# Patient Record
Sex: Male | Born: 1965 | Race: Asian | Hispanic: No | Marital: Married | Smoking: Current every day smoker
Health system: Southern US, Community
[De-identification: ages and names within clinical notes are randomized; demographics above are authoritative.]

## PROBLEM LIST (undated history)

## (undated) DIAGNOSIS — I10 Essential (primary) hypertension: Secondary | ICD-10-CM

## (undated) DIAGNOSIS — N200 Calculus of kidney: Secondary | ICD-10-CM

## (undated) HISTORY — DX: Calculus of kidney: N20.0

## (undated) HISTORY — PX: VEIN SURGERY: SHX48

---

## 2010-10-07 DIAGNOSIS — I8390 Asymptomatic varicose veins of unspecified lower extremity: Secondary | ICD-10-CM | POA: Insufficient documentation

## 2010-10-07 DIAGNOSIS — R141 Gas pain: Secondary | ICD-10-CM | POA: Insufficient documentation

## 2011-01-26 DIAGNOSIS — L409 Psoriasis, unspecified: Secondary | ICD-10-CM | POA: Insufficient documentation

## 2012-05-30 DIAGNOSIS — G43009 Migraine without aura, not intractable, without status migrainosus: Secondary | ICD-10-CM | POA: Insufficient documentation

## 2012-05-30 DIAGNOSIS — R0683 Snoring: Secondary | ICD-10-CM | POA: Insufficient documentation

## 2012-05-30 DIAGNOSIS — F17200 Nicotine dependence, unspecified, uncomplicated: Secondary | ICD-10-CM | POA: Insufficient documentation

## 2012-05-31 DIAGNOSIS — I10 Essential (primary) hypertension: Secondary | ICD-10-CM | POA: Insufficient documentation

## 2012-06-16 DIAGNOSIS — E785 Hyperlipidemia, unspecified: Secondary | ICD-10-CM | POA: Insufficient documentation

## 2018-01-28 ENCOUNTER — Ambulatory Visit
Admission: EM | Admit: 2018-01-28 | Discharge: 2018-01-28 | Disposition: A | Discharge status: Home / Self Care | Payer: Self-pay | Attending: Emergency Medicine | Admitting: Emergency Medicine

## 2018-01-28 ENCOUNTER — Other Ambulatory Visit: Payer: Self-pay

## 2018-01-28 DIAGNOSIS — R51 Headache: Secondary | ICD-10-CM | POA: Insufficient documentation

## 2018-01-28 DIAGNOSIS — R519 Headache, unspecified: Secondary | ICD-10-CM

## 2018-01-28 DIAGNOSIS — I10 Essential (primary) hypertension: Secondary | ICD-10-CM | POA: Insufficient documentation

## 2018-01-28 HISTORY — DX: Essential (primary) hypertension: I10

## 2018-01-28 MED ORDER — IBUPROFEN 600 MG PO TABS: 600.0000 mg | ORAL_TABLET | Freq: Four times a day (QID) | ORAL | 0 refills | Status: DC | PRN

## 2018-01-28 MED ORDER — BUTALBITAL-APAP-CAFFEINE 50-325-40 MG PO TABS: 1.0000 | ORAL_TABLET | ORAL | 0 refills | Status: DC | PRN

## 2018-01-28 MED ORDER — AMLODIPINE BESYLATE 2.5 MG PO TABS: 2.5000 mg | ORAL_TABLET | Freq: Every day | ORAL | 1 refills | Status: DC

## 2018-01-28 NOTE — ED Triage Notes (Addendum)
Pt with 3 days of intermittent headaches, "motion sickness" (feels nauseated when moving head). Pt was diagnosed with HTN by PCP and prescribed blood pressure meds but pt never took them and stopped going to that PCP. No PCP at this time.

## 2018-01-28 NOTE — ED Provider Notes (Signed)
HPI  SUBJECTIVE:  Julian Thompson is a 52 y.o. male who presents with 2 days of intermittent gradual onset headaches  accompanied with nausea and dizziness.  States the headache is located along the top and posterior aspect of his head.  States that it lasts approximately 30 minutes to an hour.  He denies vomiting, fevers, neck stiffness, rash.  No visual changes, arm or leg weakness, facial droop, slurred speech, aphasia.  This is not the first or worst headache of his life.  States that he has had headaches like this before and they have been diagnosed as migraines.  They are different today and that they are lasting longer than usual.  States that he had a negative head MRI 1 work-up for his headaches initially.  He denies syncope, seizures.  He also states that his eyes feel tired, states that his prescription glasses are several years old and that his vision is gotten worse over the past year.  No nasal congestion, rhinorrhea, sinus pain or pressure, dental pain, ear pain.  Tried Excedrin with improvement in his symptoms.  Symptoms are worse when he is tired. The headaches are not associated with reading.  He has a past medical history of hypertension for which she was prescribed losartan and hydro-Tron.  He states that he never took either 1 of these.  His he does not check his blood pressure at home.  He smokes.  He has a history of hypercholesterolemia, migraines.  No history of MI, diabetes, stroke, kidney disease.  Family history significant for father with hypertension, aneurysm.  PMD: None.  [place] in [place].   [facility] with hypertension, he was being treated with losartan 100 mg qd, 12.5 mg chlorthalidone.  No other records available.   Past Medical History:  Diagnosis Date  . Hypertension     Past Surgical History:  Procedure Laterality Date  . VEIN SURGERY      Family History  Problem Relation Age of Onset  . Hypertension Father      Social History   Tobacco Use  . Smoking status: Current Every Day Smoker    Packs/day: 1.00    Types: Cigarettes  . Smokeless tobacco: Never Used  Substance Use Topics  . Alcohol use: Yes    Comment: rare  . Drug use: Never    No current facility-administered medications for this encounter.   Current Outpatient Medications:  .  amLODipine (NORVASC) 2.5 MG tablet, Take 1 tablet (2.5 mg total) by mouth daily., Disp: 30 tablet, Rfl: 1 .  butalbital-acetaminophen-caffeine (FIORICET, ESGIC) 50-325-40 MG tablet, Take 1-2 tablets by mouth every 4 (four) hours as needed for headache. Max 6 caps/day, Disp: 20 tablet, Rfl: 0 .  ibuprofen (ADVIL,MOTRIN) 600 MG tablet, Take 1 tablet (600 mg total) by mouth every 6 (six) hours as needed., Disp: 30 tablet, Rfl: 0  No Known Allergies   ROS  As noted in HPI.   Physical Exam  BP (!) 166/119 (BP Location: Right Arm)   Pulse 77   Temp 98.4 F (36.9 C) (Oral)   Resp 16   Ht 5\' 7"  (1.702 m)   Wt 74.8 kg   SpO2 100%   BMI 25.84 kg/m   Constitutional: Well developed, well nourished, no acute distress Eyes: PERRLA, EOMI, conjunctiva normal bilaterally, no photophobia HENT: Normocephalic, atraumatic,mucus membranes moist.  TMs normal bilaterally.  No nasal congestion.  No sinus tenderness.  Normal dentition.  No TMJ tenderness.  Neck: No cervical lymphadenopathy, meningismus, trapezial tenderness. Respiratory: Normal inspiratory effort, lungs clear bilaterally Cardiovascular: Normal rate rhythm no murmurs rubs or gallops  GI: nondistended, soft, nontender, active bowel sounds. skin: No rash, skin intact Musculoskeletal: no deformities Neurologic: Alert & oriented x 3, cranial nerves III through XII intact, finger-nose, heel shin within normal limits.  Romberg negative.  Tandem gait steady. Psychiatric: Speech and behavior appropriate   ED Course   Medications - No data to display  No orders of the defined types were placed in  this encounter.   No results found for this or any previous visit (from the past 24 hour(s)). No results found.  ED Clinical Impression  Hypertension, unspecified type  Acute nonintractable headache, unspecified headache type   ED Assessment/Plan  Outside records reviewed.  As noted in HPI.   1.  Hypertension.  Blood pressure noted today.  He is currently asymptomatic, denies chest pain, shortness of breath, pain tearing through to his back, abdominal pain, anuria, hematuria, swelling in his lower extremities.  He does not have a headache right now.  However due to his history of hypertension, will start him on 2.5 mg of Norvasc today.  He will buy a blood pressure cuff and keep a log of his blood pressure.  He is to measure his blood pressure once a day, preferably the same time every day, if he has a headache he will measure his blood pressure as well.  Providing a primary care referral list, however he may return here in 2 or 3 weeks if unable to find a primary care provider at that point time reevaluation and possible medication adjustment.  Instructed him to bring in his blood pressure cuff and log if he does return here.  2.  Headache.  Suspect migraine.  Doubt stroke, hypertensive emergency.  No evidence of sinusitis, TMJ, ICH.  Will send home with ibuprofen 600 mg combined with thousand milligrams of Tylenol together 3 or 4 times a day as needed for pain, Fioricet for severe pain.  [facility].  Feel that it is appropriate to prescribe a controlled substance today.  No opiate prescriptions in 2 years.  Discussed MDM, treatment plan, and plan for follow-up with patient. Discussed sn/sx that should prompt return to the ED. patient agrees with plan.   Meds ordered this encounter  Medications  . ibuprofen (ADVIL,MOTRIN) 600 MG tablet    Sig: Take 1 tablet (600 mg total) by mouth every 6 (six) hours as needed.    Dispense:  30 tablet    Refill:  0  .  butalbital-acetaminophen-caffeine (FIORICET, ESGIC) 50-325-40 MG tablet    Sig: Take 1-2 tablets by mouth every 4 (four) hours as needed for headache. Max 6 caps/day    Dispense:  20 tablet    Refill:  0  . amLODipine (NORVASC) 2.5 MG tablet    Sig: Take 1 tablet (2.5 mg total) by mouth daily.    Dispense:  30 tablet    Refill:  1    *This clinic note was created using Scientist, clinical (histocompatibility and immunogenetics)Dragon dictation software. Therefore, there may be occasional mistakes despite careful proofreading.   ?   Domenick GongMortenson, Valli Randol, MD 01/28/18 1715

## 2018-01-28 NOTE — Discharge Instructions (Addendum)
Decrease your salt intake. diet and exercise will lower your blood pressure significantly. It is important to keep your blood pressure under good control, as having a elevated blood pressure for prolonged periods of time significantly increases your risk of stroke, heart attacks, kidney damage, eye damage, and other problems. Measure your blood pressure once a day, preferably at the same time every day. Keep a log of this and bring it to your next doctor's appointment.  Bring your blood pressure cuff as well.  Return here in 2 weeks for blood pressure recheck if you're unable to find a primary care physician by then. Return immediately to the ER if you start having chest pain, headache, problems seeing, problems talking, problems walking, if you feel like you're about to pass out, if you do pass out, if you have a seizure, or for any other concerns.  Start taking the Norvasc daily as directed.  Here is a list of primary care providers who are taking new patients:  Dr. Elizabeth Sauereanna Jones, Dr. Schuyler AmorWilliam Plonk [address] [place] [address] [phone]  [facility] [place] [address]  [place] [address]  [phone]  [facility] [address]  [place], [place] [address] [phone]  [facility] [place] [address]  [phone] [place], [place] [address]  Here are clinics/ other resources who will see you if you do not have insurance. Some have certain criteria that you must meet. Call them and find out what they are:  [facility]: [address] [place] [place], [place] [address] Phone: [phone] Hours: First and Third Saturdays of each Month, 9 a.m. - 1 p.m.  [facility]: [address], [place], [place] [address] Phone: [phone] Hours: Tuesday, 4 p.m. - 8 p.m. Thursday, 1 p.m. - 8 p.m. Wednesday, 9 a.m. - [facility]  [facility] [address], [place], [place] [address] Phone: [phone] Pharmacy Phone  Number: [phone] Dental Phone Number: [phone] [facility] Insurance Help: [phone]  Dental Hours: Monday - Thursday, 8 a.m. - 6 p.m.  [facility] [address] [place], [place] [address] Phone: [phone] Pharmacy Phone Number: [phone] [facility] Insurance Help: [phone]  [facility] [address] [place], [place] [address] Phone: [phone] Pharmacy Phone Number: [phone] [facility] Insurance Help: [phone]  [facility] [address] [place], [place] [address] Phone: [phone] [facility] Insurance Help: [phone]   [facility] [address] [place], [place] [address] Phone: [phone]  Go to www.goodrx.com to look up your medications. This will give you a list of where you can find your prescriptions at the most affordable prices. Or ask the pharmacist what the cash price is, or if they have any other discount programs available to help make your medication more affordable. This can be less expensive than what you would pay with insurance.

## 2018-02-13 ENCOUNTER — Encounter: Payer: Self-pay | Admitting: Emergency Medicine

## 2018-02-13 ENCOUNTER — Ambulatory Visit
Admission: EM | Admit: 2018-02-13 | Discharge: 2018-02-13 | Disposition: A | Discharge status: Home / Self Care | Payer: Self-pay | Attending: Family Medicine | Admitting: Family Medicine

## 2018-02-13 ENCOUNTER — Other Ambulatory Visit: Payer: Self-pay

## 2018-02-13 DIAGNOSIS — Z720 Tobacco use: Secondary | ICD-10-CM

## 2018-02-13 DIAGNOSIS — I10 Essential (primary) hypertension: Secondary | ICD-10-CM

## 2018-02-13 MED ORDER — AMLODIPINE BESYLATE 5 MG PO TABS: 5.0000 mg | ORAL_TABLET | Freq: Every day | ORAL | 0 refills | Status: DC

## 2018-02-13 NOTE — Discharge Instructions (Signed)
I have increased your Amlodipine to 5 mg daily.  You need to quit smoking.  See your PCP as scheduled.  Take care  Dr. Adriana Simas

## 2018-02-13 NOTE — ED Provider Notes (Signed)
[facility]    CSN: [id number] Arrival date & time: 02/13/18  1533  History   Chief Complaint Chief Complaint  Patient presents with  . Hypertension   HPI  53 year old male presents with probable hypertension.  Patient states that he was instructed to follow-up by Dr. Chaney Malling.  Patient reports that his blood pressures have improved but he continues to have elevated readings.  Diastolic tends to be elevated more so than his systolic.  His blood pressure is fairly well controlled, currently.  Much improved from his prior readings.  Denies headache.  States that he is feeling otherwise well.  Endorses compliance.  No other associated symptoms.  No other complaints.  PMH, Surgical Hx, Family Hx, Social History reviewed and updated as below.  Past Medical History:  Diagnosis Date  . Hypertension    Past Surgical History:  Procedure Laterality Date  . VEIN SURGERY     Home Medications    Prior to Admission medications   Medication Sig Start Date End Date Taking? Authorizing Provider  butalbital-acetaminophen-caffeine (FIORICET, ESGIC) 50-325-40 MG tablet Take 1-2 tablets by mouth every 4 (four) hours as needed for headache. Max 6 caps/day 01/28/18  Yes Domenick Gong, MD  amLODipine (NORVASC) 5 MG tablet Take 1 tablet (5 mg total) by mouth daily. 02/13/18   Tommie Sams, DO   Family History Family History  Problem Relation Age of Onset  . Other Mother        unsure of medical history  . Hypertension Father    Social History Social History   Tobacco Use  . Smoking status: Current Every Day Smoker    Packs/day: 1.00    Types: Cigarettes  . Smokeless tobacco: Never Used  Substance Use Topics  . Alcohol use: Yes    Comment: rare  . Drug use: Never   Allergies   Patient has no known allergies.   Review of Systems Review of Systems  Constitutional: Negative.   Cardiovascular: Negative.    Physical Exam Triage Vital Signs ED Triage Vitals [02/13/18  1551]  Enc Vitals Group     BP (!) 118/102     Pulse Rate 89     Resp 16     Temp 98.5 F (36.9 C)     Temp Source Oral     SpO2 100 %     Weight 160 lb (72.6 kg)     Height 5\' 7"  (1.702 m)     Head Circumference      Peak Flow      Pain Score 0     Pain Loc      Pain Edu?      Excl. in GC?    Updated Vital Signs BP (!) 125/96 (BP Location: Right Arm)   Pulse 89   Temp 98.5 F (36.9 C) (Oral)   Resp 16   Ht 5\' 7"  (1.702 m)   Wt 72.6 kg   SpO2 100%   BMI 25.06 kg/m   Visual Acuity Right Eye Distance:   Left Eye Distance:   Bilateral Distance:    Right Eye Near:   Left Eye Near:    Bilateral Near:     Physical Exam Vitals signs and nursing note reviewed.  Constitutional:      General: He is not in acute distress. HENT:     Head: Normocephalic and atraumatic.  Eyes:     General:        Right eye: No discharge.  Left eye: No discharge.     Conjunctiva/sclera: Conjunctivae normal.  Cardiovascular:     Rate and Rhythm: Normal rate and regular rhythm.  Pulmonary:     Effort: Pulmonary effort is normal.     Breath sounds: No wheezing, rhonchi or rales.  Neurological:     Mental Status: He is alert.  Psychiatric:        Mood and Affect: Mood normal.        Behavior: Behavior normal.    UC Treatments / Results  Labs (all labs ordered are listed, but only abnormal results are displayed) Labs Reviewed - No data to display  EKG None  Radiology No results found.  Procedures Procedures (including critical care time)  Medications Ordered in UC Medications - No data to display  Initial Impression / Assessment and Plan / UC Course  I have reviewed the triage vital signs and the nursing notes.  Pertinent labs & imaging results that were available during my care of the patient were reviewed by me and considered in my medical decision making (see chart for details).    53 year old male presents with improving hypertension.  Still not at goal.   Increasing amlodipine to 5 mg daily.  Follow-up with PCP  Final Clinical Impressions(s) / UC Diagnoses   Final diagnoses:  Essential hypertension     Discharge Instructions     I have increased your Amlodipine to 5 mg daily.  You need to quit smoking.  See your PCP as scheduled.  Take care  Dr. Adriana Simasook    ED Prescriptions    Medication Sig Dispense Auth. Provider   amLODipine (NORVASC) 5 MG tablet Take 1 tablet (5 mg total) by mouth daily. 30 tablet Tommie Samsook, Bettyann Birchler G, DO     Controlled Substance Prescriptions [place] Controlled Substance Registry consulted? Not Applicable   Tommie SamsCook, Auriella Wieand G, DO 02/13/18 1735

## 2018-02-13 NOTE — ED Triage Notes (Signed)
Patient in today c/o HTN. Was seen on 01/28/18 and given HTN medications and told to follow up in 2 weeks per Dr. Chaney Malling. Patient has an appointment with a new PCP on 03/07/18.

## 2018-03-07 DIAGNOSIS — Z1211 Encounter for screening for malignant neoplasm of colon: Secondary | ICD-10-CM | POA: Insufficient documentation

## 2018-03-07 DIAGNOSIS — Z23 Encounter for immunization: Secondary | ICD-10-CM | POA: Insufficient documentation

## 2018-03-07 DIAGNOSIS — M542 Cervicalgia: Secondary | ICD-10-CM | POA: Insufficient documentation

## 2018-08-21 DIAGNOSIS — G479 Sleep disorder, unspecified: Secondary | ICD-10-CM | POA: Insufficient documentation

## 2018-08-21 DIAGNOSIS — M545 Low back pain, unspecified: Secondary | ICD-10-CM | POA: Insufficient documentation

## 2018-08-21 DIAGNOSIS — M79661 Pain in right lower leg: Secondary | ICD-10-CM | POA: Insufficient documentation

## 2018-08-21 DIAGNOSIS — M79662 Pain in left lower leg: Secondary | ICD-10-CM | POA: Insufficient documentation

## 2018-08-21 DIAGNOSIS — Z23 Encounter for immunization: Secondary | ICD-10-CM | POA: Insufficient documentation

## 2018-08-21 DIAGNOSIS — E785 Hyperlipidemia, unspecified: Secondary | ICD-10-CM | POA: Insufficient documentation

## 2018-11-26 ENCOUNTER — Emergency Department
Admission: EM | Admit: 2018-11-26 | Discharge: 2018-11-26 | Disposition: A | Discharge status: Home / Self Care | Payer: No Typology Code available for payment source | Attending: Student in an Organized Health Care Education/Training Program | Admitting: Student in an Organized Health Care Education/Training Program

## 2018-11-26 ENCOUNTER — Emergency Department: Payer: No Typology Code available for payment source

## 2018-11-26 ENCOUNTER — Other Ambulatory Visit: Payer: Self-pay

## 2018-11-26 DIAGNOSIS — F17210 Nicotine dependence, cigarettes, uncomplicated: Secondary | ICD-10-CM | POA: Insufficient documentation

## 2018-11-26 DIAGNOSIS — N200 Calculus of kidney: Secondary | ICD-10-CM | POA: Diagnosis not present

## 2018-11-26 DIAGNOSIS — I10 Essential (primary) hypertension: Secondary | ICD-10-CM | POA: Insufficient documentation

## 2018-11-26 DIAGNOSIS — Z79899 Other long term (current) drug therapy: Secondary | ICD-10-CM | POA: Diagnosis not present

## 2018-11-26 DIAGNOSIS — R1032 Left lower quadrant pain: Secondary | ICD-10-CM | POA: Diagnosis present

## 2018-11-26 LAB — CBC
HCT: 46.5 % (ref 39.0–52.0)
Hemoglobin: 15.8 g/dL (ref 13.0–17.0)
MCH: 29.4 pg (ref 26.0–34.0)
MCHC: 34 g/dL (ref 30.0–36.0)
MCV: 86.4 fL (ref 80.0–100.0)
Platelets: 314 10*3/uL (ref 150–400)
RBC: 5.38 MIL/uL (ref 4.22–5.81)
RDW: 12.7 % (ref 11.5–15.5)
WBC: 17.5 10*3/uL — ABNORMAL HIGH (ref 4.0–10.5)
nRBC: 0 % (ref 0.0–0.2)

## 2018-11-26 LAB — COMPREHENSIVE METABOLIC PANEL
ALT: 25 U/L (ref 0–44)
AST: 26 U/L (ref 15–41)
Albumin: 4.5 g/dL (ref 3.5–5.0)
Alkaline Phosphatase: 104 U/L (ref 38–126)
Anion gap: 12 (ref 5–15)
BUN: 19 mg/dL (ref 6–20)
CO2: 25 mmol/L (ref 22–32)
Calcium: 9.4 mg/dL (ref 8.9–10.3)
Chloride: 100 mmol/L (ref 98–111)
Creatinine, Ser: 1.2 mg/dL (ref 0.61–1.24)
GFR calc Af Amer: >60 mL/min (ref >60)
GFR calc non Af Amer: >60 mL/min (ref >60)
Glucose, Bld: 151 mg/dL — ABNORMAL HIGH (ref 70–99)
Potassium: 5.2 mmol/L — ABNORMAL HIGH (ref 3.5–5.1)
Sodium: 137 mmol/L (ref 135–145)
Total Bilirubin: 1.5 mg/dL — ABNORMAL HIGH (ref 0.3–1.2)
Total Protein: 7.5 g/dL (ref 6.5–8.1)

## 2018-11-26 LAB — URINALYSIS, COMPLETE (UACMP) WITH MICROSCOPIC
Bacteria, UA: NONE SEEN
Bilirubin Urine: NEGATIVE
Glucose, UA: NEGATIVE mg/dL
Ketones, ur: 20 mg/dL — AB
Leukocytes,Ua: NEGATIVE
Nitrite: NEGATIVE
Protein, ur: 100 mg/dL — AB
RBC / HPF: >50 RBC/hpf — ABNORMAL HIGH (ref 0–5)
Specific Gravity, Urine: 1.025 (ref 1.005–1.030)
pH: 6 (ref 5.0–8.0)

## 2018-11-26 LAB — LIPASE, BLOOD: Lipase: 15 U/L (ref 11–51)

## 2018-11-26 MED ORDER — TAMSULOSIN HCL 0.4 MG PO CAPS
0.4000 mg | ORAL_CAPSULE | Freq: Every day | ORAL | Status: DC
  Administered 2018-11-26: 0.4 mg via ORAL
  Filled 2018-11-26: qty 1

## 2018-11-26 MED ORDER — HYDROCODONE-ACETAMINOPHEN 5-325 MG PO TABS: 1.0000 | ORAL_TABLET | ORAL | 0 refills | Status: DC | PRN

## 2018-11-26 MED ORDER — SODIUM CHLORIDE 0.9 % IV BOLUS
1000.0000 mL | Freq: Once | INTRAVENOUS | Status: AC
  Administered 2018-11-26: 1000 mL via INTRAVENOUS

## 2018-11-26 MED ORDER — TAMSULOSIN HCL 0.4 MG PO CAPS: 0.4000 mg | ORAL_CAPSULE | Freq: Every day | ORAL | 0 refills | Status: DC

## 2018-11-26 MED ORDER — HYDROCODONE-ACETAMINOPHEN 5-325 MG PO TABS
2.0000 | ORAL_TABLET | Freq: Once | ORAL | Status: AC
  Administered 2018-11-26: 2 via ORAL
  Filled 2018-11-26: qty 2

## 2018-11-26 MED ORDER — ONDANSETRON HCL 4 MG PO TABS: 4.0000 mg | ORAL_TABLET | Freq: Every day | ORAL | 0 refills | Status: DC | PRN

## 2018-11-26 MED ORDER — KETOROLAC TROMETHAMINE 30 MG/ML IJ SOLN
15.0000 mg | Freq: Once | INTRAMUSCULAR | Status: AC
  Administered 2018-11-26: 15 mg via INTRAVENOUS
  Filled 2018-11-26: qty 1

## 2018-11-26 MED ORDER — ONDANSETRON HCL 4 MG/2ML IJ SOLN: 4.0000 mg | Freq: Once | INTRAMUSCULAR | Status: DC

## 2018-11-26 MED ORDER — MORPHINE SULFATE (PF) 4 MG/ML IV SOLN: 4.0000 mg | INTRAVENOUS | Status: DC | PRN

## 2018-11-26 MED ORDER — IOHEXOL 300 MG/ML  SOLN
100.0000 mL | Freq: Once | INTRAMUSCULAR | Status: AC | PRN
  Administered 2018-11-26: 100 mL via INTRAVENOUS

## 2018-11-26 MED ORDER — SODIUM CHLORIDE 0.9 % IV BOLUS
500.0000 mL | Freq: Once | INTRAVENOUS | Status: AC
  Administered 2018-11-26: 500 mL via INTRAVENOUS

## 2018-11-26 MED ORDER — SODIUM CHLORIDE 0.9% FLUSH: 3.0000 mL | Freq: Once | INTRAVENOUS | Status: DC

## 2018-11-26 NOTE — ED Notes (Signed)
MD to bedside.

## 2018-11-26 NOTE — ED Provider Notes (Signed)
[facility] Emergency Department Provider Note    First MD Initiated Contact with Patient 11/26/18 1552     (approximate)  I have reviewed the triage vital signs and the nursing notes.   HISTORY  Chief Complaint Abdominal Pain    HPI Julian Thompson is a 53 y.o. male history of hypertension presents to ER with sudden onset severe left flank pain radiating into his left lower quadrant area.  States he was playing golf and was initially feeling well but started having progressively worsening pain causing her to feel nauseated did have several episodes of vomiting.  No measured fevers.  Rated pain as 10 out of 10 in severity.  Denies any history of kidney stones.  Did take "pain killer "given to him by his friends but did not have much improvement.    Past Medical History:  Diagnosis Date  . Hypertension    Family History  Problem Relation Age of Onset  . Other Mother        unsure of medical history  . Hypertension Father    Past Surgical History:  Procedure Laterality Date  . VEIN SURGERY     There are no active problems to display for this patient.     Prior to Admission medications   Medication Sig Start Date End Date Taking? Authorizing Provider  amLODipine (NORVASC) 5 MG tablet Take 1 tablet (5 mg total) by mouth daily. 02/13/18   Tommie Samsook, Jayce G, DO  butalbital-acetaminophen-caffeine (FIORICET, ESGIC) [id number] MG tablet Take 1-2 tablets by mouth every 4 (four) hours as needed for headache. Max 6 caps/day 01/28/18   Domenick GongMortenson, Ashley, MD  HYDROcodone-acetaminophen (NORCO) 5-325 MG tablet Take 1 tablet by mouth every 4 (four) hours as needed for moderate pain. 11/26/18   Willy Eddyobinson, Arlind Klingerman, MD  ondansetron (ZOFRAN) 4 MG tablet Take 1 tablet (4 mg total) by mouth daily as needed. 11/26/18 11/26/19  Willy Eddyobinson, Evangelina Delancey, MD  tamsulosin (FLOMAX) 0.4 MG CAPS capsule Take 1 capsule (0.4 mg total) by mouth daily after supper. 11/26/18   Willy Eddyobinson, Nereyda Bowler, MD     Allergies Patient has no known allergies.    Social History Social History   Tobacco Use  . Smoking status: Current Every Day Smoker    Packs/day: 1.00    Types: Cigarettes  . Smokeless tobacco: Never Used  Substance Use Topics  . Alcohol use: Yes    Comment: rare  . Drug use: Never    Review of Systems Patient denies headaches, rhinorrhea, blurry vision, numbness, shortness of breath, chest pain, edema, cough, abdominal pain, nausea, vomiting, diarrhea, dysuria, fevers, rashes or hallucinations unless otherwise stated above in HPI. ____________________________________________   PHYSICAL EXAM:  VITAL SIGNS: Vitals:   11/26/18 1556 11/26/18 1800  BP: 123/90 126/88  Pulse: 85 71  Resp: 16   Temp:    SpO2: 98% 99%    Constitutional: Alert and oriented.  Eyes: Conjunctivae are normal.  Head: Atraumatic. Nose: No congestion/rhinnorhea. Mouth/Throat: Mucous membranes are moist.   Neck: No stridor. Painless ROM.  Cardiovascular: Normal rate, regular rhythm. Grossly normal heart sounds.  Good peripheral circulation. Respiratory: Normal respiratory effort.  No retractions. Lungs CTAB. Gastrointestinal: Soft with mild ttp in RLQ. No distention. No abdominal bruits. No CVA tenderness. Genitourinary:  Musculoskeletal: No lower extremity tenderness nor edema.  No joint effusions. Neurologic:  Normal speech and language. No gross focal neurologic deficits are appreciated. No facial droop Skin:  Skin is warm, dry and intact. No rash noted. Psychiatric: Mood  and affect are normal. Speech and behavior are normal.  ____________________________________________   LABS (all labs ordered are listed, but only abnormal results are displayed)  Results for orders placed or performed during the hospital encounter of 11/26/18 (from the past 24 hour(s))  Lipase, blood     Status: None   Collection Time: 11/26/18  2:31 PM  Result Value Ref Range   Lipase 15 11 - 51 U/L  Comprehensive  metabolic panel     Status: Abnormal   Collection Time: 11/26/18  2:31 PM  Result Value Ref Range   Sodium 137 135 - 145 mmol/L   Potassium 5.2 (H) 3.5 - 5.1 mmol/L   Chloride 100 98 - 111 mmol/L   CO2 25 22 - 32 mmol/L   Glucose, Bld 151 (H) 70 - 99 mg/dL   BUN 19 6 - 20 mg/dL   Creatinine, Ser 9.98 0.61 - 1.24 mg/dL   Calcium 9.4 8.9 - 33.8 mg/dL   Total Protein 7.5 6.5 - 8.1 g/dL   Albumin 4.5 3.5 - 5.0 g/dL   AST 26 15 - 41 U/L   ALT 25 0 - 44 U/L   Alkaline Phosphatase 104 38 - 126 U/L   Total Bilirubin 1.5 (H) 0.3 - 1.2 mg/dL   GFR calc non Af Amer >60 >60 mL/min   GFR calc Af Amer >60 >60 mL/min   Anion gap 12 5 - 15  CBC     Status: Abnormal   Collection Time: 11/26/18  2:31 PM  Result Value Ref Range   WBC 17.5 (H) 4.0 - 10.5 K/uL   RBC 5.38 4.22 - 5.81 MIL/uL   Hemoglobin 15.8 13.0 - 17.0 g/dL   HCT 25.0 53.9 - 76.7 %   MCV 86.4 80.0 - 100.0 fL   MCH 29.4 26.0 - 34.0 pg   MCHC 34.0 30.0 - 36.0 g/dL   RDW 34.1 93.7 - 90.2 %   Platelets 314 150 - 400 K/uL   nRBC 0.0 0.0 - 0.2 %  Urinalysis, Complete w Microscopic     Status: Abnormal   Collection Time: 11/26/18  4:06 PM  Result Value Ref Range   Color, Urine AMBER (A) YELLOW   APPearance HAZY (A) CLEAR   Specific Gravity, Urine 1.025 1.005 - 1.030   pH 6.0 5.0 - 8.0   Glucose, UA NEGATIVE NEGATIVE mg/dL   Hgb urine dipstick LARGE (A) NEGATIVE   Bilirubin Urine NEGATIVE NEGATIVE   Ketones, ur 20 (A) NEGATIVE mg/dL   Protein, ur 409 (A) NEGATIVE mg/dL   Nitrite NEGATIVE NEGATIVE   Leukocytes,Ua NEGATIVE NEGATIVE   RBC / HPF >50 (H) 0 - 5 RBC/hpf   WBC, UA 11-20 0 - 5 WBC/hpf   Bacteria, UA NONE SEEN NONE SEEN   Squamous Epithelial / LPF 0-5 0 - 5   Mucus PRESENT    ____________________________________________   ____________________________________________  RADIOLOGY  I personally reviewed all radiographic images ordered to evaluate for the above acute complaints and reviewed radiology reports and  findings.  These findings were personally discussed with the patient.  Please see medical record for radiology report.  ____________________________________________   PROCEDURES  Procedure(s) performed:  Procedures    Critical Care performed: no ____________________________________________   INITIAL IMPRESSION / ASSESSMENT AND PLAN / ED COURSE  Pertinent labs & imaging results that were available during my care of the patient were reviewed by me and considered in my medical decision making (see chart for details).   DDX: Stone, colitis,  AAA, abscess, pyelonephritis  Julian Thompson is a 53 y.o. who presents to the ED with left leg pain as described above.  Certainly suspicious for stone.  Will order CT scan.  Does have mild leukocytosis but is also been vomiting.  Will observe in the ER is not having any evidence of fever have a lower suspicion for sepsis or infection.  Clinical Course as of Nov 25 2352  Sun Nov 26, 2018  1740 CT scan of show evidence of large left UPJ stone.  Patient's pain is improved.  Urinalysis does not show any evidence of infection.  White count likely secondary to acute onset pain nausea with vomiting.  Repeat abdominal exam soft benign.   [PR]  1803 Patient up ambulating in no discomfort.  Appears well.  Discussed case in consultation with Dr. Louis Meckel of urology.  Medication for emergent intervention at this time.  He is well-appearing and I do believe is appropriate for trial of outpatient management.  We discussed signs and symptoms for which he should return to the ER.  Will help arrange close outpatient follow up.   [PR]    Clinical Course User Index [PR] Merlyn Lot, MD    The patient was evaluated in Emergency Department today for the symptoms described in the history of present illness. He/she was evaluated in the context of the global COVID-19 pandemic, which necessitated consideration that the patient might be at risk for infection with the  SARS-CoV-2 virus that causes COVID-19. Institutional protocols and algorithms that pertain to the evaluation of patients at risk for COVID-19 are in a state of rapid change based on information released by regulatory bodies including the CDC and federal and state organizations. These policies and algorithms were followed during the patient's care in the ED.  As part of my medical decision making, I reviewed the following data within the [place] notes reviewed and incorporated, Labs reviewed, notes from prior ED visits and [place] Controlled Substance Database   ____________________________________________   FINAL CLINICAL IMPRESSION(S) / ED DIAGNOSES  Final diagnoses:  Kidney stone on left side      NEW MEDICATIONS STARTED DURING THIS VISIT:  Discharge Medication List as of 11/26/2018  6:04 PM    START taking these medications   Details  HYDROcodone-acetaminophen (NORCO) 5-325 MG tablet Take 1 tablet by mouth every 4 (four) hours as needed for moderate pain., Starting Sun 11/26/2018, Normal    ondansetron (ZOFRAN) 4 MG tablet Take 1 tablet (4 mg total) by mouth daily as needed., Starting Sun 11/26/2018, Until Mon 11/26/2019, Normal    tamsulosin (FLOMAX) 0.4 MG CAPS capsule Take 1 capsule (0.4 mg total) by mouth daily after supper., Starting Sun 11/26/2018, Normal         Note:  This document was prepared using Dragon voice recognition software and may include unintentional dictation errors.    Merlyn Lot, MD 11/26/18 [phone]

## 2018-11-26 NOTE — ED Notes (Signed)
Pt to ED reporting pain in LLQ of abd radiating to left flank.

## 2018-11-26 NOTE — ED Triage Notes (Signed)
[place], states he was playing today and had sudden onset left sided abd pain that radiates around to the flank area. Denies pain starting while swinging. Has had N/V with the pain, denies hx of kidney stones. EMS gave 27mcg of Fentanyl IV and 4 mg of zofran.

## 2018-11-26 NOTE — ED Notes (Signed)
Pt assisted to get into a recliner for comfort and given an additional warm blanket

## 2018-11-27 ENCOUNTER — Other Ambulatory Visit: Payer: Self-pay

## 2018-11-27 DIAGNOSIS — N200 Calculus of kidney: Secondary | ICD-10-CM

## 2018-11-28 ENCOUNTER — Encounter: Payer: Self-pay | Admitting: Urology

## 2018-11-28 ENCOUNTER — Ambulatory Visit
Admission: RE | Admit: 2018-11-28 | Discharge: 2018-11-28 | Disposition: A | Discharge status: Home / Self Care | Payer: No Typology Code available for payment source | Source: Ambulatory Visit | Attending: Urology | Admitting: Urology

## 2018-11-28 ENCOUNTER — Other Ambulatory Visit
Admission: RE | Admit: 2018-11-28 | Discharge: 2018-11-28 | Disposition: A | Discharge status: Home / Self Care | Payer: No Typology Code available for payment source | Source: Ambulatory Visit | Attending: Urology | Admitting: Urology

## 2018-11-28 ENCOUNTER — Ambulatory Visit
Admission: RE | Admit: 2018-11-28 | Discharge: 2018-11-28 | Disposition: A | Discharge status: Home / Self Care | Payer: No Typology Code available for payment source | Attending: Urology | Admitting: Urology

## 2018-11-28 ENCOUNTER — Ambulatory Visit (INDEPENDENT_AMBULATORY_CARE_PROVIDER_SITE_OTHER): Payer: No Typology Code available for payment source | Admitting: Urology

## 2018-11-28 ENCOUNTER — Other Ambulatory Visit: Payer: Self-pay

## 2018-11-28 VITALS — BP 121/97 | HR 80 | Ht 67.0 in | Wt 160.0 lb

## 2018-11-28 DIAGNOSIS — Z20828 Contact with and (suspected) exposure to other viral communicable diseases: Secondary | ICD-10-CM | POA: Insufficient documentation

## 2018-11-28 DIAGNOSIS — N201 Calculus of ureter: Secondary | ICD-10-CM

## 2018-11-28 NOTE — Patient Instructions (Signed)
Lithotripsy  Lithotripsy is a treatment that can sometimes help eliminate kidney stones and the pain that they cause. A form of lithotripsy, also known as extracorporeal shock wave lithotripsy, is a nonsurgical procedure that crushes a kidney stone with shock waves. These shock waves pass through your body and focus on the kidney stone. They cause the kidney stone to break up while it is still in the urinary tract. This makes it easier for the smaller pieces of stone to pass in the urine. Tell a health care provider about:  Any allergies you have.  All medicines you are taking, including vitamins, herbs, eye drops, creams, and over-the-counter medicines.  Any blood disorders you have.  Any surgeries you have had.  Any medical conditions you have.  Whether you are pregnant or may be pregnant.  Any problems you or family members have had with anesthetic medicines. What are the risks? Generally, this is a safe procedure. However, problems may occur, including:  Infection.  Bleeding of the kidney.  Bruising of the kidney or skin.  Scarring of the kidney, which can lead to: ? Increased blood pressure. ? Poor kidney function. ? Return (recurrence) of kidney stones.  Damage to other structures or organs, such as the liver, colon, spleen, or pancreas.  Blockage (obstruction) of the the tube that carries urine from the kidney to the bladder (ureter).  Failure of the kidney stone to break into pieces (fragments). What happens before the procedure? Staying hydrated Follow instructions from your health care provider about hydration, which may include:  Up to 2 hours before the procedure - you may continue to drink clear liquids, such as water, clear fruit juice, black coffee, and plain tea. Eating and drinking restrictions Follow instructions from your health care provider about eating and drinking, which may include:  8 hours before the procedure - stop eating heavy meals or foods  such as meat, fried foods, or fatty foods.  6 hours before the procedure - stop eating light meals or foods, such as toast or cereal.  6 hours before the procedure - stop drinking milk or drinks that contain milk.  2 hours before the procedure - stop drinking clear liquids. General instructions  Plan to have someone take you home from the hospital or clinic.  Ask your health care provider about: ? Changing or stopping your regular medicines. This is especially important if you are taking diabetes medicines or blood thinners. ? Taking medicines such as aspirin and ibuprofen. These medicines and other NSAIDs can thin your blood. Do not take these medicines for 7 days before your procedure if your health care provider instructs you not to.  You may have tests, such as: ? Blood tests. ? Urine tests. ? Imaging tests, such as a CT scan. What happens during the procedure?  To lower your risk of infection: ? Your health care team will wash or sanitize their hands. ? Your skin will be washed with soap.  An IV tube will be inserted into one of your veins. This tube will give you fluids and medicines.  You will be given one or more of the following: ? A medicine to help you relax (sedative). ? A medicine to make you fall asleep (general anesthetic).  A water-filled cushion may be placed behind your kidney or on your abdomen. In some cases you may be placed in a tub of lukewarm water.  Your body will be positioned in a way that makes it easy to target the kidney   stone.  A flexible tube with holes in it (stent) may be placed in the ureter. This will help keep urine flowing from the kidney if the fragments of the stone have been blocking the ureter.  An X-ray or ultrasound exam will be done to locate your stone.  Shock waves will be aimed at the stone. If you are awake, you may feel a tapping sensation as the shock waves pass through your body. The procedure may vary among health care  providers and hospitals. What happens after the procedure?  You may have an X-ray to see whether the procedure was able to break up the kidney stone and how much of the stone has passed. If large stone fragments remain after treatment, you may need to have a second procedure at a later time.  Your blood pressure, heart rate, breathing rate, and blood oxygen level will be monitored until the medicines you were given have worn off.  You may be given antibiotics or pain medicine as needed.  If a stent was placed in your ureter during surgery, it may stay in place for a few weeks.  You may need strain your urine to collect pieces of the kidney stone for testing.  You will need to drink plenty of water.  Do not drive for 24 hours if you were given a sedative. Summary  Lithotripsy is a treatment that can sometimes help eliminate kidney stones and the pain that they cause.  A form of lithotripsy, also known as extracorporeal shock wave lithotripsy, is a nonsurgical procedure that crushes a kidney stone with shock waves.  Generally, this is a safe procedure. However, problems may occur, including damage to the kidney or other organs, infection, or obstruction of the tube that carries urine from the kidney to the bladder (ureter).  When you go home, you will need to drink plenty of water. You may be asked to strain your urine to collect pieces of the kidney stone for testing. This information is not intended to replace advice given to you by your health care provider. Make sure you discuss any questions you have with your health care provider. Document Released: 01/23/2000 Document Revised: 05/08/2018 Document Reviewed: 12/17/2015 Elsevier Patient Education  2020 [employer]   Lithotripsy, Care After This sheet gives you information about how to care for yourself after your procedure. Your health care provider may also give you more specific instructions. If you have problems or questions,  contact your health care provider. What can I expect after the procedure? After the procedure, it is common to have:  Some blood in your urine. This should only last for a few days.  Soreness in your back, sides, or upper abdomen for a few days.  Blotches or bruises on your back where the pressure wave entered the skin.  Pain, discomfort, or nausea when pieces (fragments) of the kidney stone move through the tube that carries urine from the kidney to the bladder (ureter). Stone fragments may pass soon after the procedure, but they may continue to pass for up to 4-8 weeks. ? If you have severe pain or nausea, contact your health care provider. This may be caused by a large stone that was not broken up, and this may mean that you need more treatment.  Some pain or discomfort during urination.  Some pain or discomfort in the lower abdomen or (in men) at the base of the penis. Follow these instructions at home: Medicines  Take over-the-counter and prescription medicines only   as told by your health care provider.  If you were prescribed an antibiotic medicine, take it as told by your health care provider. Do not stop taking the antibiotic even if you start to feel better.  Do not drive for 24 hours if you were given a medicine to help you relax (sedative).  Do not drive or use heavy machinery while taking prescription pain medicine. Eating and drinking      Drink enough water and fluids to keep your urine clear or pale yellow. This helps any remaining pieces of the stone to pass. It can also help prevent new stones from forming.  Eat plenty of fresh fruits and vegetables.  Follow instructions from your health care provider about eating and drinking restrictions. You may be instructed: ? To reduce how much salt (sodium) you eat or drink. Check ingredients and nutrition facts on packaged foods and beverages. ? To reduce how much meat you eat.  Eat the recommended amount of calcium for  your age and gender. Ask your health care provider how much calcium you should have. General instructions  Get plenty of rest.  Most people can resume normal activities 1-2 days after the procedure. Ask your health care provider what activities are safe for you.  Your health care provider may direct you to lie in a certain position (postural drainage) and tap firmly (percuss) over your kidney area to help stone fragments pass. Follow instructions as told by your health care provider.  If directed, strain all urine through the strainer that was provided by your health care provider. ? Keep all fragments for your health care provider to see. Any stones that are found may be sent to a medical lab for examination. The stone may be as small as a grain of salt.  Keep all follow-up visits as told by your health care provider. This is important. Contact a health care provider if:  You have pain that is severe or does not get better with medicine.  You have nausea that is severe or does not go away.  You have blood in your urine longer than your health care provider told you to expect.  You have more blood in your urine.  You have pain during urination that does not go away.  You urinate more frequently than usual and this does not go away.  You develop a rash or any other possible signs of an allergic reaction. Get help right away if:  You have severe pain in your back, sides, or upper abdomen.  You have severe pain while urinating.  Your urine is very dark red.  You have blood in your stool (feces).  You cannot pass any urine at all.  You feel a strong urge to urinate after emptying your bladder.  You have a fever or chills.  You develop shortness of breath, difficulty breathing, or chest pain.  You have severe nausea that leads to persistent vomiting.  You faint. Summary  After this procedure, it is common to have some pain, discomfort, or nausea when pieces (fragments)  of the kidney stone move through the tube that carries urine from the kidney to the bladder (ureter). If this pain or nausea is severe, however, you should contact your health care provider.  Most people can resume normal activities 1-2 days after the procedure. Ask your health care provider what activities are safe for you.  Drink enough water and fluids to keep your urine clear or pale yellow. This helps any remaining   pieces of the stone to pass, and it can help prevent new stones from forming.  If directed, strain your urine and keep all fragments for your health care provider to see. Fragments or stones may be as small as a grain of salt.  Get help right away if you have severe pain in your back, sides, or upper abdomen or have severe pain while urinating. This information is not intended to replace advice given to you by your health care provider. Make sure you discuss any questions you have with your health care provider. Document Released: 02/14/2007 Document Revised: 05/08/2018 Document Reviewed: 12/17/2015 Elsevier Patient Education  2020 [employer]  

## 2018-11-28 NOTE — Progress Notes (Signed)
11/28/18 1:11 PM   Julian Thompson 1966/01/09 [id number]  CC: Left proximal ureteral stone  HPI: I saw Julian Thompson in urology clinic today for evaluation of a proximal left ureteral stone.  He is a healthy 53 year old male with no history of nephrolithiasis who presented to the ED on 11/26/2018 by ambulance with severe 10/10 left flank pain and nausea vomiting.  A CT at that time showed a 9 mm proximal ureteral stone with upstream hydronephrosis, 1350HU, 10cm SSD, clearly seen on scout CT.  He was given pain medications and he had complete resolution of his pain prior to undergoing a CT scan.  He denies any pain over the last 2 days.  He is taking Tylenol and Norco, but has not taken any NSAIDs.  He also reports a history of some pink urine approximately a week ago with some mild flank pain at that time.  He denies any fevers or chills.  Urinalysis at his ED visit was notable for greater than 50 RBCs, 11-20 WBCs, no bacteria, nitrite negative, no leukocytes.  There are no aggravating factors.  Severity is moderate to severe.   PMH: Past Medical History:  Diagnosis Date  . Hypertension   . Kidney stone     Surgical History: Past Surgical History:  Procedure Laterality Date  . VEIN SURGERY       Allergies: No Known Allergies  Family History: Family History  Problem Relation Age of Onset  . Other Mother        unsure of medical history  . Hypertension Father     Social History:  reports that he has been smoking cigarettes. He has been smoking about 1.00 pack per day. He has never used smokeless tobacco. He reports current alcohol use. He reports that he does not use drugs.  ROS: Please see flowsheet from today's date for complete review of systems.  Physical Exam: BP (!) 121/97 (BP Location: Left Arm, Patient Position: Sitting, Cuff Size: Normal)   Pulse 80   Ht 5\' 7"  (1.702 m)   Wt 160 lb (72.6 kg)   BMI 25.06 kg/m    Constitutional:  Alert and oriented, No acute distress.  Cardiovascular: Regular rate and rhythm Respiratory: CTA bilaterally GI: Abdomen is soft, nontender, nondistended, no abdominal masses GU: No CVA tenderness Lymph: No cervical or inguinal lymphadenopathy. Skin: No rashes, bruises or suspicious lesions. Neurologic: Grossly intact, no focal deficits, moving all 4 extremities. Psychiatric: Normal mood and affect.  Laboratory Data: Reviewed  Pertinent Imaging: I have personally reviewed the CT A/P 10/18: 64mm left proximal ureteral stone with hydronephrosis, 1350HU, 10cm SSD, clearly seen on scout CT  KUB today pending  Assessment & Plan:   In summary, the patient is a healthy 53 year old male with acute onset of severe left-sided flank pain on 11/26/2018 with CT showing a 9 mm left proximal ureteral stone with upstream hydronephrosis.  He is currently asymptomatic, but based on his large stone size, I suspect his stone is still present.  We discussed various treatment options for urolithiasis including observation with or without medical expulsive therapy, shockwave lithotripsy (SWL), ureteroscopy and laser lithotripsy with stent placement, and percutaneous nephrolithotomy.  We discussed that management is based on stone size, location, density, patient co-morbidities, and patient preference.   Stones <19mm in size have a >80% spontaneous passage rate. Data surrounding the use of tamsulosin for medical expulsive therapy is controversial, but meta analyses suggests it is most efficacious for distal stones between 5-67mm in size. Possible side  effects include dizziness/lightheadedness, and retrograde ejaculation.  SWL has a lower stone free rate in a single procedure, but also a lower complication rate compared to ureteroscopy and avoids a stent and associated stent related symptoms. Possible complications include renal hematoma, steinstrasse, and need for additional treatment.  Ureteroscopy with laser lithotripsy and stent placement has a  higher stone free rate than SWL in a single procedure, however increased complication rate including possible infection, ureteral injury, bleeding, and stent related morbidity. Common stent related symptoms include dysuria, urgency/frequency, and flank pain.  The patient strongly prefers shockwave lithotripsy.  We discussed at length the density of his stone and 1350HU that decreases the effectiveness of shockwave lithotripsy.  He understands these risks and the possible need for follow-up ureteroscopy and would like to proceed with shockwave lithotripsy.  KUB today to confirm stone Schedule shockwave lithotripsy this Thursday   Syna Gad C Seraya Jobst, MD  [facility] [address] [place], [place] [address] [phone]

## 2018-11-29 ENCOUNTER — Other Ambulatory Visit: Payer: Self-pay | Admitting: Radiology

## 2018-11-29 DIAGNOSIS — N201 Calculus of ureter: Secondary | ICD-10-CM

## 2018-11-29 LAB — SARS CORONAVIRUS 2 (TAT 6-24 HRS): SARS Coronavirus 2: NEGATIVE

## 2018-11-30 ENCOUNTER — Encounter
Admission: RE | Disposition: A | Discharge status: Home / Self Care | Payer: Self-pay | Source: Home / Self Care | Attending: Urology

## 2018-11-30 ENCOUNTER — Ambulatory Visit
Admission: RE | Admit: 2018-11-30 | Discharge: 2018-11-30 | Disposition: A | Discharge status: Home / Self Care | Payer: No Typology Code available for payment source | Attending: Urology | Admitting: Urology

## 2018-11-30 ENCOUNTER — Ambulatory Visit: Payer: No Typology Code available for payment source

## 2018-11-30 ENCOUNTER — Encounter: Payer: Self-pay | Admitting: *Deleted

## 2018-11-30 ENCOUNTER — Other Ambulatory Visit: Payer: Self-pay

## 2018-11-30 DIAGNOSIS — I10 Essential (primary) hypertension: Secondary | ICD-10-CM | POA: Insufficient documentation

## 2018-11-30 DIAGNOSIS — N201 Calculus of ureter: Secondary | ICD-10-CM | POA: Diagnosis present

## 2018-11-30 HISTORY — PX: EXTRACORPOREAL SHOCK WAVE LITHOTRIPSY: SHX1557

## 2018-11-30 SURGERY — LITHOTRIPSY, ESWL
Anesthesia: Moderate Sedation | Laterality: Left

## 2018-11-30 MED ORDER — DIPHENHYDRAMINE HCL 25 MG PO CAPS
ORAL_CAPSULE | ORAL | Status: AC
  Administered 2018-11-30: 11:00:00 25 mg via ORAL
  Filled 2018-11-30: qty 1

## 2018-11-30 MED ORDER — OXYCODONE-ACETAMINOPHEN 5-325 MG PO TABS: 1.0000 | ORAL_TABLET | Freq: Four times a day (QID) | ORAL | 0 refills | Status: DC | PRN

## 2018-11-30 MED ORDER — CIPROFLOXACIN HCL 500 MG PO TABS
ORAL_TABLET | ORAL | Status: AC
  Administered 2018-11-30: 11:00:00 500 mg via ORAL
  Filled 2018-11-30: qty 1

## 2018-11-30 MED ORDER — SODIUM CHLORIDE 0.9 % IV SOLN
INTRAVENOUS | Status: DC
  Administered 2018-11-30: 11:00:00 via INTRAVENOUS

## 2018-11-30 MED ORDER — DIAZEPAM 5 MG PO TABS
10.0000 mg | ORAL_TABLET | ORAL | Status: AC
  Administered 2018-11-30: 11:00:00 10 mg via ORAL

## 2018-11-30 MED ORDER — DIPHENHYDRAMINE HCL 25 MG PO CAPS
25.0000 mg | ORAL_CAPSULE | ORAL | Status: AC
  Administered 2018-11-30: 11:00:00 25 mg via ORAL

## 2018-11-30 MED ORDER — ONDANSETRON HCL 4 MG/2ML IJ SOLN
4.0000 mg | Freq: Once | INTRAMUSCULAR | Status: AC | PRN
  Administered 2018-11-30: 11:00:00 4 mg via INTRAVENOUS

## 2018-11-30 MED ORDER — CIPROFLOXACIN HCL 500 MG PO TABS
500.0000 mg | ORAL_TABLET | ORAL | Status: AC
  Administered 2018-11-30: 11:00:00 500 mg via ORAL

## 2018-11-30 MED ORDER — ONDANSETRON HCL 4 MG/2ML IJ SOLN
INTRAMUSCULAR | Status: AC
  Administered 2018-11-30: 11:00:00 4 mg via INTRAVENOUS
  Filled 2018-11-30: qty 2

## 2018-11-30 MED ORDER — DIAZEPAM 5 MG PO TABS
ORAL_TABLET | ORAL | Status: AC
  Administered 2018-11-30: 11:00:00 10 mg via ORAL
  Filled 2018-11-30: qty 2

## 2018-11-30 NOTE — Discharge Instructions (Signed)
As per the [facility] discharge instructions. 

## 2018-12-01 ENCOUNTER — Telehealth: Payer: Self-pay | Admitting: Urology

## 2018-12-01 ENCOUNTER — Encounter: Payer: Self-pay | Admitting: Urology

## 2018-12-01 DIAGNOSIS — N201 Calculus of ureter: Secondary | ICD-10-CM

## 2018-12-01 NOTE — Telephone Encounter (Signed)
Does he need a KUB?

## 2018-12-01 NOTE — Telephone Encounter (Signed)
Patient had ESWL yesterday and is calling asking for a follow up? What does he need? And with you?   Sharyn Lull

## 2018-12-01 NOTE — Telephone Encounter (Signed)
2-week follow-up with me or [facility]

## 2018-12-08 ENCOUNTER — Telehealth: Payer: Self-pay | Admitting: Urology

## 2018-12-08 DIAGNOSIS — N200 Calculus of kidney: Secondary | ICD-10-CM

## 2018-12-08 MED ORDER — TAMSULOSIN HCL 0.4 MG PO CAPS: 0.4000 mg | ORAL_CAPSULE | Freq: Every day | ORAL | 0 refills | Status: DC

## 2018-12-08 NOTE — Telephone Encounter (Signed)
Called pt informed him to continue Flomax until post op appt. Refill sent. Pt gave verbal understanding.

## 2018-12-08 NOTE — Telephone Encounter (Signed)
Pt wants to know if he needs to continue to take Tamsulosin, (he is still passing stones) if so, he needs a refill.

## 2018-12-14 ENCOUNTER — Ambulatory Visit: Payer: Self-pay | Admitting: Urology

## 2018-12-15 ENCOUNTER — Ambulatory Visit: Payer: No Typology Code available for payment source | Admitting: Urology

## 2018-12-21 ENCOUNTER — Other Ambulatory Visit: Payer: Self-pay

## 2018-12-21 DIAGNOSIS — Z20822 Contact with and (suspected) exposure to covid-19: Secondary | ICD-10-CM

## 2018-12-23 LAB — NOVEL CORONAVIRUS, NAA: SARS-CoV-2, NAA: NOT DETECTED

## 2019-01-11 ENCOUNTER — Ambulatory Visit
Admission: RE | Admit: 2019-01-11 | Discharge: 2019-01-11 | Disposition: A | Discharge status: Home / Self Care | Payer: No Typology Code available for payment source | Source: Ambulatory Visit | Attending: Urology | Admitting: Urology

## 2019-01-11 ENCOUNTER — Encounter: Payer: Self-pay | Admitting: Urology

## 2019-01-11 ENCOUNTER — Ambulatory Visit: Payer: No Typology Code available for payment source | Admitting: Urology

## 2019-01-11 ENCOUNTER — Other Ambulatory Visit: Payer: Self-pay

## 2019-01-11 VITALS — BP 154/107 | HR 78 | Ht 67.0 in | Wt 160.0 lb

## 2019-01-11 DIAGNOSIS — Z87442 Personal history of urinary calculi: Secondary | ICD-10-CM

## 2019-01-11 DIAGNOSIS — N201 Calculus of ureter: Secondary | ICD-10-CM | POA: Insufficient documentation

## 2019-01-11 NOTE — Progress Notes (Signed)
01/11/2019 12:59 PM   Julian Thompson [birth year] [id number]  Referring provider: No referring provider defined for this encounter.  Chief Complaint  Patient presents with  . Nephrolithiasis    HPI: 53 y.o. male presents for postop follow-up.  He underwent shockwave lithotripsy of a 9 mm left proximal ureteral calculus on 11/28/2018.  Stone density was 1350 HU.  Postop he had intermittent pain for 3-4 days.  He passed multiple, small fragments which he forgot to bring in today.  He has been asymptomatic and denies flank, abdominal pain.   PMH: Past Medical History:  Diagnosis Date  . Hypertension   . Kidney stone     Surgical History: Past Surgical History:  Procedure Laterality Date  . EXTRACORPOREAL SHOCK WAVE LITHOTRIPSY Left 11/30/2018   Procedure: EXTRACORPOREAL SHOCK WAVE LITHOTRIPSY (ESWL);  Surgeon: Riki Altes, MD;  Location: [facility] ORS;  Service: Urology;  Laterality: Left;  Marland Kitchen VEIN SURGERY      Home Medications:  Allergies as of 01/11/2019   No Known Allergies     Medication List       Accurate as of January 11, 2019 12:59 PM. If you have any questions, ask your nurse or doctor.        STOP taking these medications   butalbital-acetaminophen-caffeine 50-325-40 MG tablet Commonly known as: FIORICET Stopped by: Riki Altes, MD   ondansetron 4 MG tablet Commonly known as: Zofran Stopped by: Riki Altes, MD   oxyCODONE-acetaminophen 5-325 MG tablet Commonly known as: PERCOCET/ROXICET Stopped by: Riki Altes, MD   tamsulosin 0.4 MG Caps capsule Commonly known as: FLOMAX Stopped by: Riki Altes, MD     TAKE these medications   amLODipine 10 MG tablet Commonly known as: NORVASC Take 10 mg by mouth daily.   clobetasol 0.05 % external solution Commonly known as: TEMOVATE Apply topically.   ibuprofen 600 MG tablet Commonly known as: ADVIL Take by mouth.       Allergies: No Known Allergies  Family History: Family History   Problem Relation Age of Onset  . Other Mother        unsure of medical history  . Hypertension Father     Social History:  reports that he has been smoking cigarettes. He has been smoking about 1.00 pack per day. He has never used smokeless tobacco. He reports current alcohol use. He reports that he does not use drugs.  ROS: UROLOGY Frequent Urination?: No Hard to postpone urination?: No Burning/pain with urination?: No Get up at night to urinate?: No Leakage of urine?: No Urine stream starts and stops?: No Trouble starting stream?: No Do you have to strain to urinate?: No Blood in urine?: No Urinary tract infection?: No Sexually transmitted disease?: No Injury to kidneys or bladder?: No Painful intercourse?: No Weak stream?: No Erection problems?: No Penile pain?: No  Gastrointestinal Nausea?: No Vomiting?: No Indigestion/heartburn?: No Diarrhea?: No Constipation?: No  Constitutional Fever: No Night sweats?: No Weight loss?: No Fatigue?: No  Skin Skin rash/lesions?: No Itching?: No  Eyes Blurred vision?: No Double vision?: No  Ears/Nose/Throat Sore throat?: No Sinus problems?: No  Hematologic/Lymphatic Swollen glands?: No Easy bruising?: No  Cardiovascular Leg swelling?: No Chest pain?: No  Respiratory Cough?: No Shortness of breath?: No  Endocrine Excessive thirst?: No  Musculoskeletal Back pain?: No Joint pain?: No  Neurological Headaches?: No Dizziness?: No  Psychologic Depression?: No Anxiety?: No  Physical Exam: BP (!) 154/107 (BP Location: Left Arm, Patient Position: Sitting, Cuff Size:  Normal)   Pulse 78   Ht 5\' 7"  (1.702 m)   Wt 160 lb (72.6 kg)   BMI 25.06 kg/m   Constitutional:  Alert and oriented, No acute distress. HEENT: [place] AT, moist mucus membranes.  Trachea midline, no masses. Cardiovascular: No clubbing, cyanosis, or edema. Respiratory: Normal respiratory effort, no increased work of breathing.   Assessment  & Plan:    History left proximal ureteral calculus  -KUB performed today was reviewed and the previously seen calculus is no longer identified.  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  We discussed stone prevention guidelines. It was recommended  he increase his water intake to keep urine output greater than 2 L per day.  3 L of water per day is generally enough to produce this output.  Oxalate moderation was discussed and he was provided literature on high oxalate foods and beverages.  Avoidance of salty foods and added salt was discussed as well as avoidance of excessive intake of animal protein.  Increased intake of potassium rich citrus products was recommended.  -He will be notified with his ultrasound results and follow-up 1 year with a KUB.   Abbie Sons, [facility] [address], [place], [place] [address] [phone]

## 2019-01-17 ENCOUNTER — Other Ambulatory Visit: Payer: Self-pay

## 2019-01-17 ENCOUNTER — Ambulatory Visit
Admission: RE | Admit: 2019-01-17 | Discharge: 2019-01-17 | Disposition: A | Discharge status: Home / Self Care | Payer: No Typology Code available for payment source | Source: Ambulatory Visit | Attending: Urology | Admitting: Urology

## 2019-01-17 DIAGNOSIS — Z87442 Personal history of urinary calculi: Secondary | ICD-10-CM | POA: Diagnosis present

## 2019-01-18 ENCOUNTER — Telehealth: Payer: Self-pay

## 2019-01-18 NOTE — Telephone Encounter (Signed)
-----   Message from Abbie Sons, MD sent at 01/18/2019  1:06 PM EST ----- Renal ultrasound showed no calculi or residual obstruction.  Follow-up as scheduled.

## 2019-01-18 NOTE — Telephone Encounter (Signed)
Called pt informed him of the information below. Pt gave verbal understanding.  

## 2019-01-24 ENCOUNTER — Other Ambulatory Visit: Payer: Self-pay | Admitting: Internal Medicine

## 2019-01-24 ENCOUNTER — Ambulatory Visit: Payer: No Typology Code available for payment source | Attending: Internal Medicine

## 2019-01-24 DIAGNOSIS — Z20822 Contact with and (suspected) exposure to covid-19: Secondary | ICD-10-CM

## 2019-01-25 ENCOUNTER — Other Ambulatory Visit: Payer: No Typology Code available for payment source

## 2019-01-26 LAB — NOVEL CORONAVIRUS, NAA: SARS-CoV-2, NAA: NOT DETECTED

## 2019-03-02 ENCOUNTER — Ambulatory Visit: Payer: No Typology Code available for payment source | Attending: Internal Medicine

## 2019-03-02 DIAGNOSIS — Z20822 Contact with and (suspected) exposure to covid-19: Secondary | ICD-10-CM

## 2019-03-03 LAB — NOVEL CORONAVIRUS, NAA: SARS-CoV-2, NAA: NOT DETECTED

## 2020-01-11 ENCOUNTER — Ambulatory Visit: Payer: No Typology Code available for payment source | Admitting: Urology

## 2021-05-25 IMAGING — CR DG ABDOMEN 1V
3 series · 3 of 3 positions shown · non-contrast
Comparison: CT abdomen pelvis 11/26/2018

CLINICAL DATA: Eval left ureteral stone

EXAM:
ABDOMEN - 1 VIEW

[abdomen kub (1 of 3)]
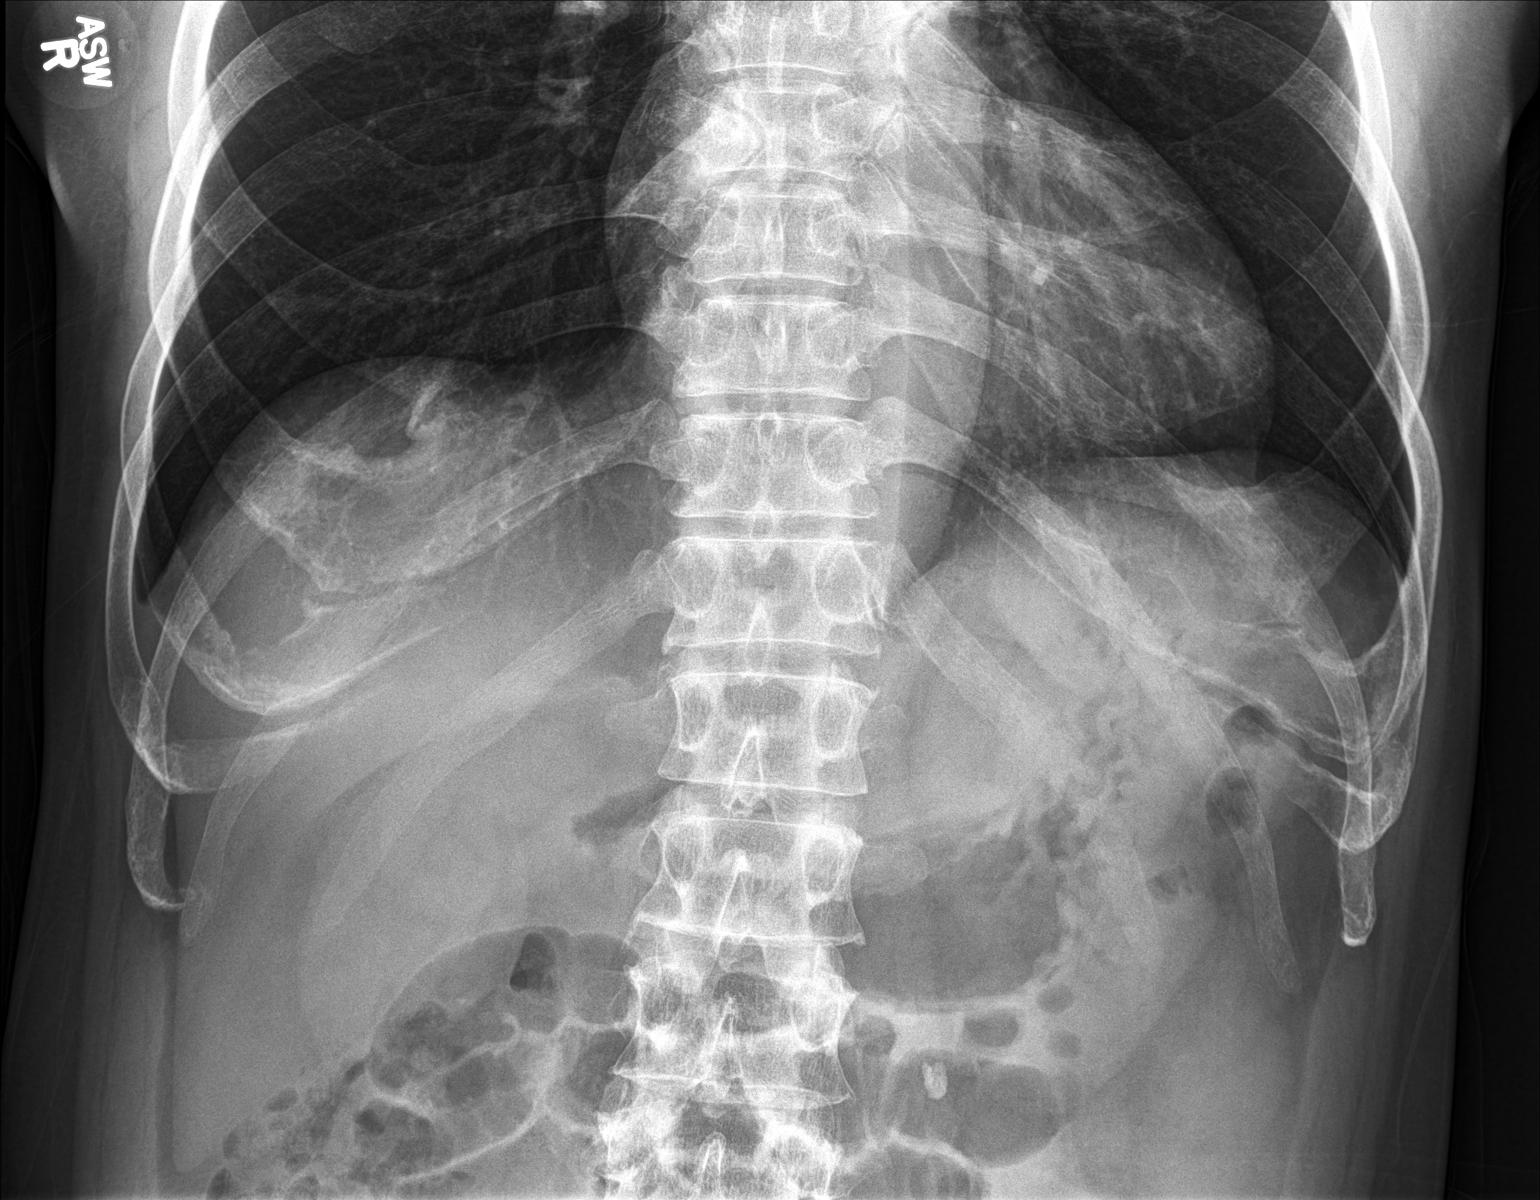

[abdomen kub (2 of 3)]
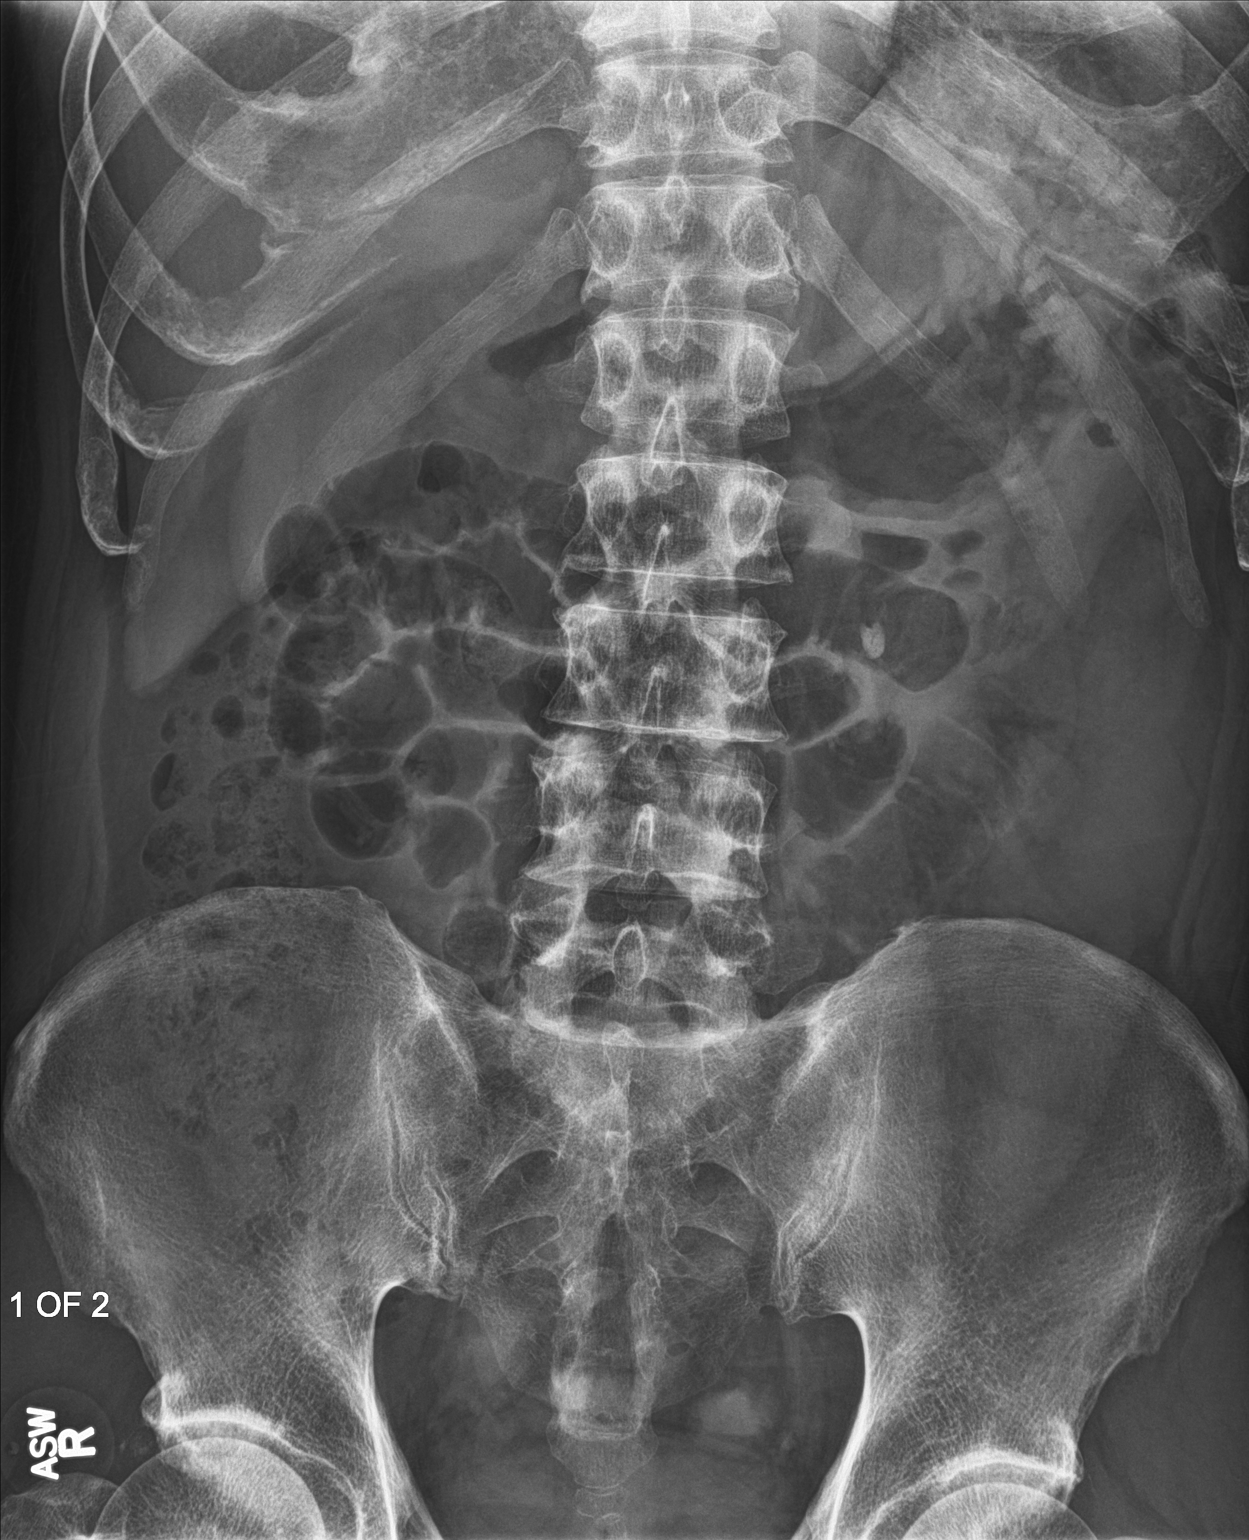

[abdomen kub (3 of 3)]
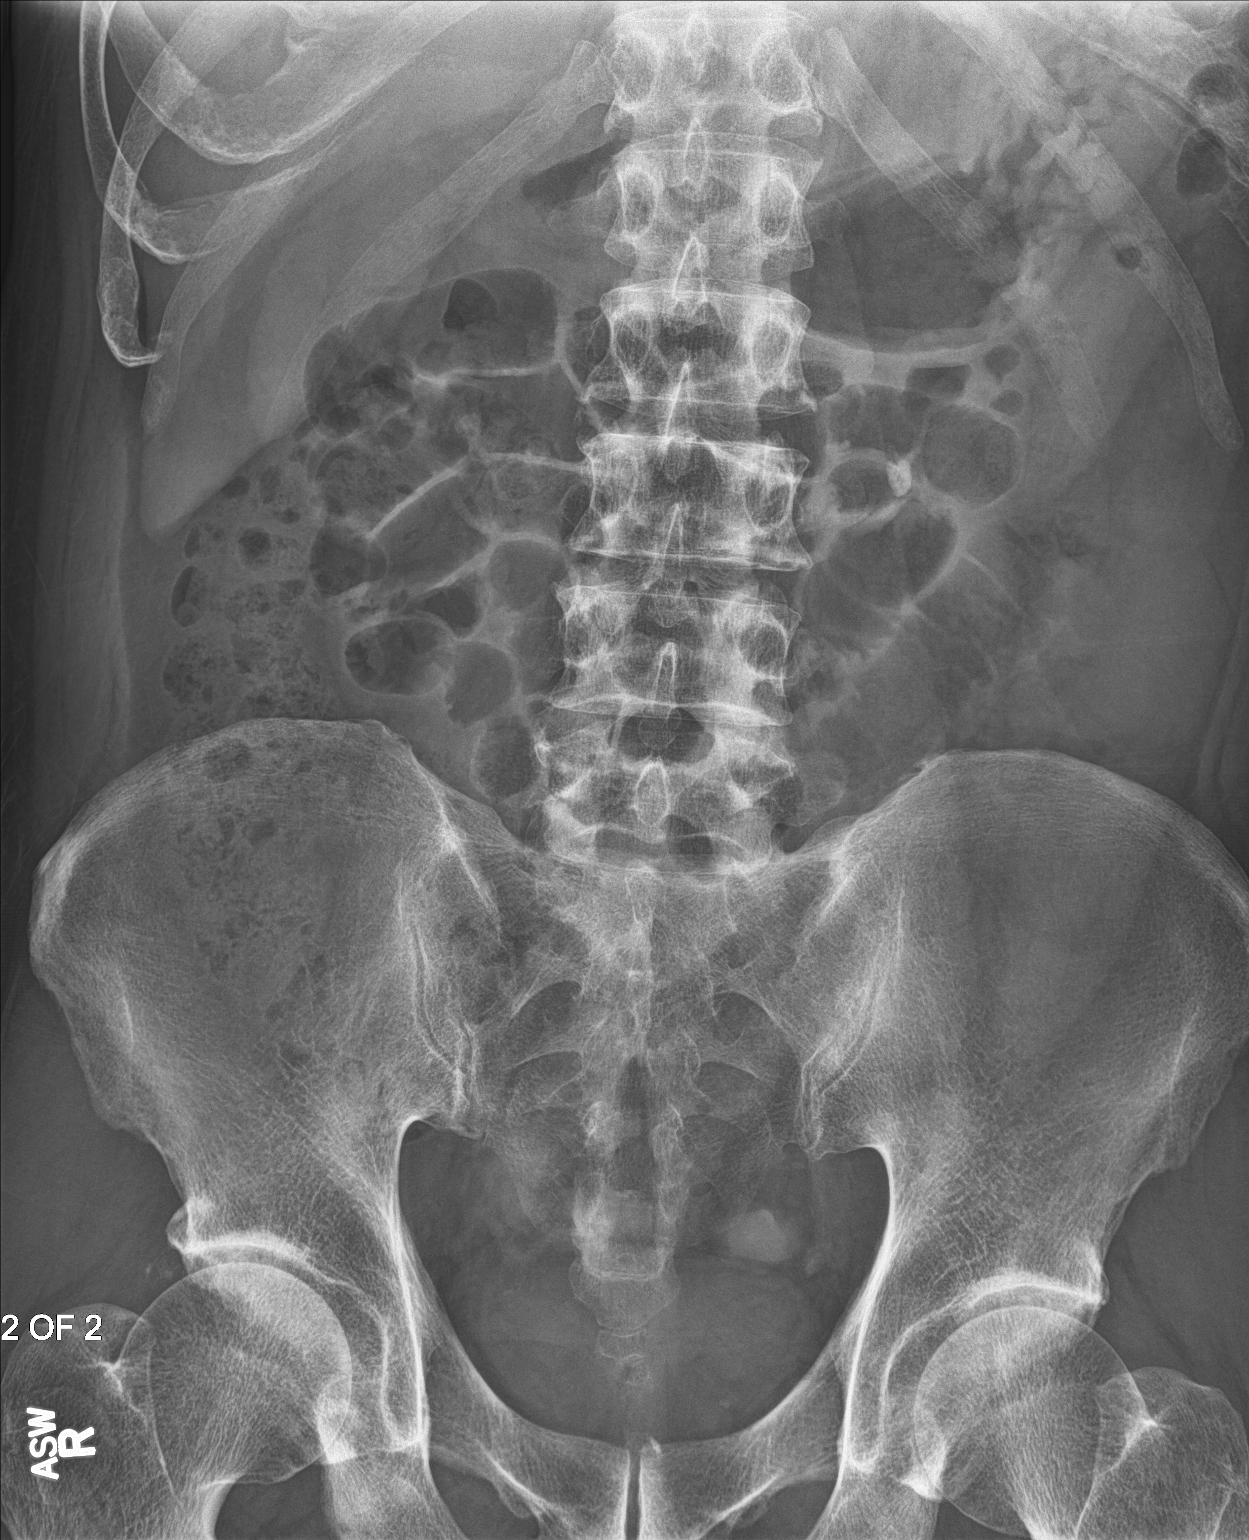

[3 of 3 positions shown; findings below may reference images not displayed]

FINDINGS: Coarse 9 mm calcification remains at the level of the left
ureteropelvic junction corresponding well to that which was seen on
comparison CT. No additional urinary tract calcifications are
present. Few phleboliths and prosthetic calcifications project over
the pelvis. Mid abdominal small bowel of appears air-filled but
nondistended. Air and stool overlies the colon. No other suspicious
abdominal abnormalities. Partial fusion of the SI joints.
Degenerative changes at the lumbosacral junction and both hips. No
other significant osseous features.
IMPRESSION: 1. 9 mm calcification remains at the level of the left ureteropelvic
junction, corresponding well to that seen on comparison CT. No
significant distal migration.
2. No additional urinary tract calcifications.
3. Nonobstructive bowel gas pattern.
